# Patient Record
Sex: Female | Born: 1985 | Race: Black or African American | Hispanic: No | Marital: Married | Smoking: Never smoker
Health system: Southern US, Community
[De-identification: ages and names within clinical notes are randomized; demographics above are authoritative.]

## PROBLEM LIST (undated history)

## (undated) DIAGNOSIS — Z5900 Homelessness unspecified: Secondary | ICD-10-CM

## (undated) DIAGNOSIS — J45909 Unspecified asthma, uncomplicated: Secondary | ICD-10-CM

---

## 2016-11-03 ENCOUNTER — Encounter (HOSPITAL_COMMUNITY): Payer: Self-pay | Admitting: *Deleted

## 2016-11-03 ENCOUNTER — Emergency Department (HOSPITAL_COMMUNITY): Payer: Self-pay

## 2016-11-03 DIAGNOSIS — J40 Bronchitis, not specified as acute or chronic: Secondary | ICD-10-CM | POA: Insufficient documentation

## 2016-11-03 NOTE — ED Triage Notes (Signed)
Pt reports "asthma flare up" about an hour ago.  Pt's husband is also being seen.  She reports productive cough with "clear" sputum.. Denies any fever.  Pt is A&Ox 4.  Completes a sentence without pausing.

## 2016-11-04 ENCOUNTER — Emergency Department (HOSPITAL_COMMUNITY)
Admission: EM | Admit: 2016-11-04 | Discharge: 2016-11-04 | Disposition: A | Discharge status: Home / Self Care | Payer: Self-pay | Attending: Emergency Medicine | Admitting: Emergency Medicine

## 2016-11-04 DIAGNOSIS — J40 Bronchitis, not specified as acute or chronic: Secondary | ICD-10-CM

## 2016-11-04 HISTORY — DX: Unspecified asthma, uncomplicated: J45.909

## 2016-11-04 MED ORDER — IPRATROPIUM-ALBUTEROL 0.5-2.5 (3) MG/3ML IN SOLN
3.0000 mL | Freq: Once | RESPIRATORY_TRACT | Status: AC
  Administered 2016-11-04: 3 mL via RESPIRATORY_TRACT

## 2016-11-04 MED ORDER — PREDNISONE 10 MG PO TABS: 40.0000 mg | ORAL_TABLET | Freq: Every day | ORAL | 0 refills | Status: DC

## 2016-11-04 MED ORDER — ALBUTEROL SULFATE (2.5 MG/3ML) 0.083% IN NEBU
2.5000 mg | INHALATION_SOLUTION | Freq: Once | RESPIRATORY_TRACT | Status: AC
  Administered 2016-11-04: 2.5 mg via RESPIRATORY_TRACT
  Filled 2016-11-04: qty 3

## 2016-11-04 MED ORDER — PREDNISONE 20 MG PO TABS
ORAL_TABLET | ORAL | Status: AC
  Filled 2016-11-04: qty 3

## 2016-11-04 MED ORDER — IPRATROPIUM-ALBUTEROL 0.5-2.5 (3) MG/3ML IN SOLN
RESPIRATORY_TRACT | Status: AC
  Filled 2016-11-04: qty 3

## 2016-11-04 MED ORDER — CEPHALEXIN 500 MG PO CAPS: 500.0000 mg | ORAL_CAPSULE | Freq: Two times a day (BID) | ORAL | 0 refills | Status: DC

## 2016-11-04 MED ORDER — ALBUTEROL SULFATE HFA 108 (90 BASE) MCG/ACT IN AERS
2.0000 | INHALATION_SPRAY | Freq: Once | RESPIRATORY_TRACT | Status: AC
  Administered 2016-11-04: 2 via RESPIRATORY_TRACT
  Filled 2016-11-04: qty 6.7

## 2016-11-04 MED ORDER — PREDNISONE 20 MG PO TABS
60.0000 mg | ORAL_TABLET | Freq: Once | ORAL | Status: AC
  Administered 2016-11-04: 60 mg via ORAL

## 2016-11-04 NOTE — ED Notes (Signed)
RT paged for 3rd neb tx

## 2016-11-04 NOTE — Discharge Instructions (Signed)
Take prednisone as prescribed until all gone. Take Keflex as prescribed until all gone. Use inhaler 2 puffs every 4 hours. Please follow-up with family doctor.

## 2016-11-04 NOTE — ED Provider Notes (Signed)
[facility]-EMERGENCY DEPT Provider Note   CSN: [id number] Arrival date & time: 11/03/16  1957     History   Chief Complaint Chief Complaint  Patient presents with  . Shortness of Breath    HPI Lindsay Hatfield is a 31 y.o. female.  HPI Lindsay Hatfield is a 31 y.o. female with history of asthma, presents to emergency department complaining cough and shortness of breath. Patient states her symptoms began approximately a week ago. She reports cough with productive thick mucus. She reports that today she has started feeling shortness of breath and wheezing. She did not try any medications prior to coming in. Denies any fever. She reports nasal congestion, mucus, sore throat. Denies any extremity swelling. No difficulty speaking. No other complaints.  Past Medical History:  Diagnosis Date  . Asthma     There are no active problems to display for this patient.   History reviewed. No pertinent surgical history.  OB History    No data available       Home Medications    Prior to Admission medications   Not on File    Family History No family history on file.  Social History Social History  Substance Use Topics  . Smoking status: Never Smoker  . Smokeless tobacco: Never Used  . Alcohol use No     Allergies   Patient has no known allergies.   Review of Systems Review of Systems  Constitutional: Negative for chills and fever.  HENT: Positive for congestion, sinus pain and sore throat.   Respiratory: Positive for cough, shortness of breath and wheezing. Negative for chest tightness.   Cardiovascular: Negative for chest pain, palpitations and leg swelling.  Gastrointestinal: Negative for abdominal pain, diarrhea, nausea and vomiting.  Genitourinary: Negative for dysuria, flank pain and pelvic pain.  Musculoskeletal: Negative for arthralgias, myalgias, neck pain and neck stiffness.  Skin: Negative for rash.  Neurological: Negative for dizziness, weakness and  headaches.  All other systems reviewed and are negative.    Physical Exam Updated Vital Signs BP (!) 147/97 (BP Location: Left Arm)   Pulse (!) 105   Temp 98.2 F (36.8 C) (Oral)   Resp 20   Ht 5\' 6"  (1.676 m)   Wt (!) 173.7 kg (383 lb)   LMP 10/20/2016   SpO2 99%   BMI 61.82 kg/m   Physical Exam  Constitutional: She appears well-developed and well-nourished. No distress.  HENT:  Head: Normocephalic.  Eyes: Conjunctivae are normal.  Neck: Neck supple.  Cardiovascular: Normal rate, regular rhythm and normal heart sounds.   Pulmonary/Chest: Effort normal. No respiratory distress. She has wheezes. She has no rales.  Inspiratory and expiratory wheezes bilaterally, coughing  Abdominal: Soft. Bowel sounds are normal. She exhibits no distension. There is no tenderness. There is no rebound.  Musculoskeletal: She exhibits no edema.  Neurological: She is alert.  Skin: Skin is warm and dry.  Psychiatric: She has a normal mood and affect. Her behavior is normal.  Nursing note and vitals reviewed.    ED Treatments / Results  Labs (all labs ordered are listed, but only abnormal results are displayed) Labs Reviewed - No data to display  EKG  EKG Interpretation None       Radiology Dg Chest 2 View  Result Date: 11/03/2016 CLINICAL DATA:  Dyspnea and asthma EXAM: CHEST  2 VIEW COMPARISON:  None. FINDINGS: The heart size and mediastinal contours are within normal limits. Both lungs are clear. The visualized skeletal structures are unremarkable. IMPRESSION:  No active cardiopulmonary disease. Electronically Signed   By: Tollie Eth M.D.   On: 11/03/2016 20:37    Procedures Procedures (including critical care time)  Medications Ordered in ED Medications  ipratropium-albuterol (DUONEB) 0.5-2.5 (3) MG/3ML nebulizer solution 3 mL (not administered)  predniSONE (DELTASONE) tablet 60 mg (not administered)     Initial Impression / Assessment and Plan / ED Course  I have  reviewed the triage vital signs and the nursing notes.  Pertinent labs & imaging results that were available during my care of the patient were reviewed by me and considered in my medical decision making (see chart for details).     Patient in emergency department with wheezing, cough, URI. Patient does have inspiratory neck surgery wheezes bilaterally on exam. Chest x-ray was obtained at triage is negative. Patient has normal oxygen saturation. Will try DuoNeb, prednisone. Will reassess.  4:09 AM Patient received 3 breathing treatments. She is feeling slightly better, continues to cough up mucus. Stable for discharge home at this time. Will start on Keflex, she states she is unable to afford any other antibiotic unless it is on the $4 list, but in setting of productive sputum, cough for over a week, asthma, I think patient may benefit from antibiotic treatment. Will also prescribe a burst of prednisone. Inhaler provided to take home. Return precautions discussed. Advised to follow-up with the family doctor.  Vitals:   11/04/16 0215 11/04/16 0245 11/04/16 0300 11/04/16 0315  BP:      Pulse: 97 100 (!) 103 (!) 103  Resp:      Temp:      TempSrc:      SpO2: 100% 97% 98% 97%  Weight:      Height:         Final Clinical Impressions(s) / ED Diagnoses   Final diagnoses:  Bronchitis    New Prescriptions New Prescriptions   CEPHALEXIN (KEFLEX) 500 MG CAPSULE    Take 1 capsule (500 mg total) by mouth 2 (two) times daily.   PREDNISONE (DELTASONE) 10 MG TABLET    Take 4 tablets (40 mg total) by mouth daily.     Jaynie Crumble, PA-C 11/04/16 0411    Devoria Albe, MD 11/04/16 [phone]

## 2017-05-27 ENCOUNTER — Emergency Department (HOSPITAL_COMMUNITY)
Admission: EM | Admit: 2017-05-27 | Discharge: 2017-05-28 | Disposition: A | Discharge status: Home / Self Care | Payer: Self-pay | Attending: Emergency Medicine | Admitting: Emergency Medicine

## 2017-05-27 ENCOUNTER — Encounter (HOSPITAL_COMMUNITY): Payer: Self-pay | Admitting: Emergency Medicine

## 2017-05-27 ENCOUNTER — Other Ambulatory Visit: Payer: Self-pay

## 2017-05-27 DIAGNOSIS — J4521 Mild intermittent asthma with (acute) exacerbation: Secondary | ICD-10-CM | POA: Insufficient documentation

## 2017-05-27 DIAGNOSIS — L03011 Cellulitis of right finger: Secondary | ICD-10-CM | POA: Insufficient documentation

## 2017-05-27 MED ORDER — ALBUTEROL SULFATE HFA 108 (90 BASE) MCG/ACT IN AERS
1.0000 | INHALATION_SPRAY | Freq: Once | RESPIRATORY_TRACT | Status: AC
  Administered 2017-05-28: 2 via RESPIRATORY_TRACT
  Filled 2017-05-27: qty 6.7

## 2017-05-27 MED ORDER — ALBUTEROL SULFATE (2.5 MG/3ML) 0.083% IN NEBU
5.0000 mg | INHALATION_SOLUTION | Freq: Once | RESPIRATORY_TRACT | Status: AC
  Administered 2017-05-27: 5 mg via RESPIRATORY_TRACT
  Filled 2017-05-27: qty 6

## 2017-05-27 MED ORDER — PREDNISONE 20 MG PO TABS
60.0000 mg | ORAL_TABLET | Freq: Once | ORAL | Status: AC
  Administered 2017-05-27: 60 mg via ORAL
  Filled 2017-05-27: qty 3

## 2017-05-27 MED ORDER — PREDNISONE 10 MG PO TABS: 20.0000 mg | ORAL_TABLET | Freq: Every day | ORAL | 0 refills | Status: AC

## 2017-05-27 NOTE — ED Provider Notes (Signed)
[facility]-EMERGENCY DEPT Provider Note   CSN: [id number] Arrival date & time: 05/27/17  1912     History   Chief Complaint Chief Complaint  Patient presents with  . Hand Pain  . Otalgia  . Wheezing    HPI Lindsay Hatfield is a 32 y.o. female with past medical history of asthma, presenting to the ED with multiple complaints.  Patient complaining of acute onset of pain to her right third finger surrounding the nail.  She states she noticed it today, that it was swollen and painful.  She states she stuck a pin in it and drained purulent fluid.  She states the pain is significantly improved since that time.  Patient second complaint is that she has been having increased wheezing associated with her asthma with the weather change.  She states she used her albuterol inhaler once today at 11 AM, however has not used it since.  She denies difficulty breathing, upper respiratory symptoms, or other complaints.  Not seen her PCP in over a year regarding her asthma management.  Requesting a new inhaler today as hers is nearly out. Patient's there is complaint is itching/tingling sensation in her right ear.  Does endorse seasonal allergies, however has not taken any medications for allergies.  Denies ear pain, decreased hearing, or drainage.  The history is provided by the patient.    Past Medical History:  Diagnosis Date  . Asthma     There are no active problems to display for this patient.   History reviewed. No pertinent surgical history.  OB History    No data available       Home Medications    Prior to Admission medications   Medication Sig Start Date End Date Taking? Authorizing Provider  albuterol (PROVENTIL HFA;VENTOLIN HFA) 108 (90 Base) MCG/ACT inhaler Inhale 2 puffs into the lungs every 6 (six) hours as needed for wheezing or shortness of breath.   Yes [provider]  cephALEXin (KEFLEX) 500 MG capsule Take 1 capsule (500 mg total) by  mouth 2 (two) times daily. Patient not taking: Reported on 05/27/2017 11/04/16   Jaynie CrumbleKirichenko, Tatyana, PA-C  predniSONE (DELTASONE) 10 MG tablet Take 2 tablets (20 mg total) by mouth daily for 5 days. 05/27/17 06/01/17  Tocara Mennen, [place] N, PA-C    Family History History reviewed. No pertinent family history.  Social History Social History   Tobacco Use  . Smoking status: Never Smoker  . Smokeless tobacco: Never Used  Substance Use Topics  . Alcohol use: No  . Drug use: No     Allergies   Patient has no known allergies.   Review of Systems Review of Systems  Constitutional: Negative for fever.  HENT: Negative for congestion, ear discharge and ear pain.        Right ear itching  Respiratory: Positive for wheezing. Negative for cough and shortness of breath.   Musculoskeletal:       Right third finger pain and swelling  All other systems reviewed and are negative.    Physical Exam Updated Vital Signs BP (!) 149/96 (BP Location: Left Wrist)   Pulse 90   Temp 98.4 F (36.9 C) (Oral)   Resp 18   Ht 5\' 6"  (1.676 m)   Wt (!) 173.9 kg (383 lb 4.8 oz)   LMP 05/15/2017   SpO2 97%   BMI 61.87 kg/m   Physical Exam  Constitutional: She appears well-developed and well-nourished.  Morbidly obese.  Well-appearing, not distressed.  HENT:  Head: Normocephalic and atraumatic.  Right Ear: Tympanic membrane, external ear and ear canal normal.  Left Ear: Tympanic membrane, external ear and ear canal normal.  Mouth/Throat: Oropharynx is clear and moist.  Eyes: Conjunctivae are normal.  Pulmonary/Chest: Effort normal. No stridor. No respiratory distress. She has wheezes. She has no rales.  No increased work of breathing.  Very mild expiratory wheezes bilaterally.  Musculoskeletal:  Medial aspect of right distal third digit with erythema and mild swelling surrounding medial border of fingernail.  Area is not fluctuant, with scant purulent drainage noted.  Digit with normal range of  motion.  Psychiatric: She has a normal mood and affect. Her behavior is normal.  Nursing note and vitals reviewed.    ED Treatments / Results  Labs (all labs ordered are listed, but only abnormal results are displayed) Labs Reviewed - No data to display  EKG  EKG Interpretation None       Radiology No results found.  Procedures Procedures (including critical care time)  Medications Ordered in ED Medications  albuterol (PROVENTIL HFA;VENTOLIN HFA) 108 (90 Base) MCG/ACT inhaler 1-2 puff (not administered)  albuterol (PROVENTIL) (2.5 MG/3ML) 0.083% nebulizer solution 5 mg (5 mg Nebulization Given 05/27/17 2322)  predniSONE (DELTASONE) tablet 60 mg (60 mg Oral Given 05/27/17 2259)     Initial Impression / Assessment and Plan / ED Course  I have reviewed the triage vital signs and the nursing notes.  Pertinent labs & imaging results that were available during my care of the patient were reviewed by me and considered in my medical decision making (see chart for details).  Clinical Course as of May 28 2351  Thu May 27, 2017  2341 Patient reevaluated, with clear lung sounds and improvement in respiratory symptoms.  Will discharge with albuterol inhaler.  [JR]    Clinical Course User Index [JR] Christyana Corwin, [place] N, PA-C    Patient presenting to the ED with multiple complaints, including wheezing secondary to her asthma, right third digit pain which appears to be paronychia, and right ear itching.  On exam, patient without increased work of breathing, very mild expiratory wheezes bilaterally.  O2 saturation 96% on room air.  Right third digit with what appears to be a paronychia that patient drained earlier today; no fluctuance amenable to drainage in the ED.  Ear exam is unremarkable. Albuterol neb and dose of prednisone given in the ED, with improvement in symptoms and lung sounds.  Will send with new albuterol inhaler and recommendation that she follow-up with her PCP.  Also  encouraged warm water soaks/warm compresses for paronychia.  Lastly, recommend patient take allergy medications for the itching in her ears, as exam is unremarkable without signs of infection.  Patient is well-appearing, safe for discharge at this time.  Discussed results, findings, treatment and follow up. Patient advised of return precautions. Patient verbalized understanding and agreed with plan.  Final Clinical Impressions(s) / ED Diagnoses   Final diagnoses:  Exacerbation of intermittent asthma, unspecified asthma severity  Paronychia of right middle finger    ED Discharge Orders        Ordered    predniSONE (DELTASONE) 10 MG tablet  Daily     05/27/17 2346       Maysin Carstens, [place] N, [place] 05/27/17 2353    Maia Plan, MD 05/28/17 1004

## 2017-05-27 NOTE — ED Triage Notes (Signed)
Pt states she has been wheezing a lot due to her asthma and the weather change  Pt also states her middle finger on her right hand is swollen and painful around the nail bed and has been oozing pus  Pt is also c/o her right ear has been tingling and itching

## 2017-05-27 NOTE — Discharge Instructions (Signed)
Please read instructions below.  Soak/flush your wound with warm water, multiple times per day. You can take Advil/ibuprofen every 6 hours as needed for pain. Use your inhaler every 4-6 hours as needed for shortness of breath. Follow up with your primary care or urgent care for wound recheck in 2 days; as well as for management of your asthma. Return to the ER for fever, shortness of breath not improved with your inhaler, or new or concerning symptoms.

## 2017-07-12 ENCOUNTER — Encounter (HOSPITAL_BASED_OUTPATIENT_CLINIC_OR_DEPARTMENT_OTHER): Payer: Self-pay

## 2017-07-12 ENCOUNTER — Other Ambulatory Visit: Payer: Self-pay

## 2017-07-12 ENCOUNTER — Emergency Department (HOSPITAL_BASED_OUTPATIENT_CLINIC_OR_DEPARTMENT_OTHER)
Admission: EM | Admit: 2017-07-12 | Discharge: 2017-07-13 | Disposition: A | Discharge status: Home / Self Care | Payer: Self-pay | Attending: Emergency Medicine | Admitting: Emergency Medicine

## 2017-07-12 DIAGNOSIS — J45909 Unspecified asthma, uncomplicated: Secondary | ICD-10-CM | POA: Insufficient documentation

## 2017-07-12 DIAGNOSIS — L03011 Cellulitis of right finger: Secondary | ICD-10-CM | POA: Insufficient documentation

## 2017-07-12 MED ORDER — DOXYCYCLINE HYCLATE 100 MG PO TABS
100.0000 mg | ORAL_TABLET | Freq: Once | ORAL | Status: AC
  Administered 2017-07-13: 100 mg via ORAL
  Filled 2017-07-12: qty 1

## 2017-07-12 MED ORDER — CEPHALEXIN 250 MG PO CAPS
500.0000 mg | ORAL_CAPSULE | Freq: Once | ORAL | Status: AC
  Administered 2017-07-13: 500 mg via ORAL
  Filled 2017-07-12: qty 2

## 2017-07-12 NOTE — ED Triage Notes (Signed)
C/o pain/swellling to right middle finger x 1 month-NAD-steady gait

## 2017-07-13 ENCOUNTER — Encounter (HOSPITAL_BASED_OUTPATIENT_CLINIC_OR_DEPARTMENT_OTHER): Payer: Self-pay | Admitting: Emergency Medicine

## 2017-07-13 MED ORDER — CEPHALEXIN 500 MG PO CAPS: 500.0000 mg | ORAL_CAPSULE | Freq: Four times a day (QID) | ORAL | 0 refills | Status: DC

## 2017-07-13 MED ORDER — DOXYCYCLINE HYCLATE 100 MG PO CAPS: 100.0000 mg | ORAL_CAPSULE | Freq: Two times a day (BID) | ORAL | 0 refills | Status: DC

## 2017-07-13 NOTE — ED Notes (Signed)
Pt verbalizes understanding of d/c instructions and denies any further needs at this time. 

## 2017-07-13 NOTE — ED Provider Notes (Signed)
[facility] EMERGENCY DEPARTMENT Provider Note   CSN: [id number] Arrival date & time: 07/12/17  2113     History   Chief Complaint Chief Complaint  Patient presents with  . Hand Pain    HPI Lindsay Hatfield is a 32 y.o. female.  The history is provided by the patient.  Hand Pain  This is a new problem. The current episode started more than 1 week ago (beginning of March). The problem occurs constantly. The problem has not changed since onset.Pertinent negatives include no chest pain, no abdominal pain and no shortness of breath. Nothing aggravates the symptoms. Nothing relieves the symptoms. She has tried nothing for the symptoms. The treatment provided no relief.  Has swelling about the cuticle of the right middle finger.  Has been poking it with a needle without improvement.  No streaking.    Past Medical History:  Diagnosis Date  . Asthma     There are no active problems to display for this patient.   History reviewed. No pertinent surgical history.   OB History   None      Home Medications    Prior to Admission medications   Medication Sig Start Date End Date Taking? Authorizing Provider  albuterol (PROVENTIL HFA;VENTOLIN HFA) 108 (90 Base) MCG/ACT inhaler Inhale 2 puffs into the lungs every 6 (six) hours as needed for wheezing or shortness of breath.    [provider]  cephALEXin (KEFLEX) 500 MG capsule Take 1 capsule (500 mg total) by mouth 2 (two) times daily. Patient not taking: Reported on 05/27/2017 11/04/16   Jaynie Crumble, PA-C  cephALEXin (KEFLEX) 500 MG capsule Take 1 capsule (500 mg total) by mouth 4 (four) times daily. 07/13/17   Jose Alleyne, MD  doxycycline (VIBRAMYCIN) 100 MG capsule Take 1 capsule (100 mg total) by mouth 2 (two) times daily. One po bid x 7 days 07/13/17   Bonnetta Allbee, MD    Family History No family history on file.  Social History Social History   Tobacco Use  . Smoking status: Never Smoker  .  Smokeless tobacco: Never Used  Substance Use Topics  . Alcohol use: No  . Drug use: No     Allergies   Patient has no known allergies.   Review of Systems Review of Systems  Constitutional: Negative for fever.  Respiratory: Negative for shortness of breath.   Cardiovascular: Negative for chest pain.  Gastrointestinal: Negative for abdominal pain.  Musculoskeletal: Negative for back pain.  Skin: Negative for color change.  All other systems reviewed and are negative.    Physical Exam Updated Vital Signs BP (!) 152/105 (BP Location: Left Arm)   Pulse 80   Temp 98.3 F (36.8 C) (Oral)   Resp 20   Ht  (1.676 m)   Wt (!) 179.4 kg (395 lb 8.1 oz)   LMP 07/06/2017   SpO2 98%   BMI 63.84 kg/m   Physical Exam  Constitutional: She is oriented to person, place, and time. She appears well-developed and well-nourished. No distress.  HENT:  Head: Normocephalic and atraumatic.  Mouth/Throat: No oropharyngeal exudate.  Eyes: Pupils are equal, round, and reactive to light. Conjunctivae are normal.  Neck: Normal range of motion. Neck supple.  Cardiovascular: Normal rate, regular rhythm, normal heart sounds and intact distal pulses.  Pulmonary/Chest: Effort normal and breath sounds normal. No stridor. She has no wheezes. She has no rales.  Abdominal: Soft. Bowel sounds are normal. She exhibits no mass. There is no  tenderness. There is no guarding.  Musculoskeletal: Normal range of motion.       Right hand: She exhibits normal capillary refill. Normal sensation noted. Normal strength noted.       Hands: Neurological: She is alert and oriented to person, place, and time.  Skin: Skin is warm and dry. Capillary refill takes less than 2 seconds.  Psychiatric: She has a normal mood and affect.     ED Treatments / Results   Procedures .Marland KitchenIncision and Drainage Date/Time: 07/13/2017 12:07 AM Performed by: Cy Blamer, MD Authorized by: Cy Blamer, MD   Consent:     Consent obtained:  Verbal   Consent given by:  Patient   Risks discussed:  Incomplete drainage and infection   Alternatives discussed:  No treatment Location:    Indications for incision and drainage: paronychia.   Location:  Upper extremity   Upper extremity location:  Finger   Finger location:  R long finger Pre-procedure details:    Skin preparation:  Chloraprep Anesthesia (see MAR for exact dosages):    Anesthesia method:  None Procedure type:    Complexity:  Simple Procedure details:    Incision types:  Single straight   Incision and drainage depth: under cuticle.   Scalpel blade:  11   Wound management:  Irrigated with saline and probed and deloculated   Drainage:  Purulent   Drainage amount:  Moderate   Wound treatment:  Wound left open   Packing materials:  None Post-procedure details:    Patient tolerance of procedure:  Tolerated well, no immediate complications   (including critical care time)  Medications Ordered in ED Medications  doxycycline (VIBRA-TABS) tablet 100 mg (has no administration in time range)  cephALEXin (KEFLEX) capsule 500 mg (has no administration in time range)      Final Clinical Impressions(s) / ED Diagnoses   Final diagnoses:  Paronychia of right middle finger   Follow up with hand surgery if symptoms persist  Patient verbalizes understanding and agrees to follow up.    Return for weakness, numbness, changes in vision or speech, fevers >100.4 unrelieved by medication, shortness of breath, intractable vomiting, or diarrhea, abdominal pain, Inability to tolerate liquids or food, cough, altered mental status or any concerns. No signs of systemic illness or infection. The patient is nontoxic-appearing on exam and vital signs are within normal limits.   I have reviewed the triage vital signs and the nursing notes. Pertinent labs &imaging results that were available during my care of the patient were reviewed by me and considered in my  medical decision making (see chart for details).  After history, exam, and medical workup I feel the patient has been appropriately medically screened and is safe for discharge home. Pertinent diagnoses were discussed with the patient. Patient was given return precautions.  ED Discharge Orders        Ordered    doxycycline (VIBRAMYCIN) 100 MG capsule  2 times daily     07/13/17 0006    cephALEXin (KEFLEX) 500 MG capsule  4 times daily     07/13/17 0006       Vibha Ferdig, MD 07/13/17 [phone]

## 2017-12-31 ENCOUNTER — Other Ambulatory Visit: Payer: Self-pay

## 2017-12-31 ENCOUNTER — Emergency Department (HOSPITAL_BASED_OUTPATIENT_CLINIC_OR_DEPARTMENT_OTHER)
Admission: EM | Admit: 2017-12-31 | Discharge: 2017-12-31 | Disposition: A | Discharge status: Home / Self Care | Payer: Self-pay | Attending: Emergency Medicine | Admitting: Emergency Medicine

## 2017-12-31 ENCOUNTER — Encounter (HOSPITAL_BASED_OUTPATIENT_CLINIC_OR_DEPARTMENT_OTHER): Payer: Self-pay

## 2017-12-31 DIAGNOSIS — J9801 Acute bronchospasm: Secondary | ICD-10-CM | POA: Insufficient documentation

## 2017-12-31 DIAGNOSIS — J45909 Unspecified asthma, uncomplicated: Secondary | ICD-10-CM | POA: Insufficient documentation

## 2017-12-31 DIAGNOSIS — Z79899 Other long term (current) drug therapy: Secondary | ICD-10-CM | POA: Insufficient documentation

## 2017-12-31 HISTORY — DX: Morbid (severe) obesity due to excess calories: E66.01

## 2017-12-31 MED ORDER — ALBUTEROL SULFATE (2.5 MG/3ML) 0.083% IN NEBU
2.5000 mg | INHALATION_SOLUTION | Freq: Once | RESPIRATORY_TRACT | Status: AC
  Administered 2017-12-31: 2.5 mg via RESPIRATORY_TRACT
  Filled 2017-12-31: qty 3

## 2017-12-31 MED ORDER — ALBUTEROL SULFATE HFA 108 (90 BASE) MCG/ACT IN AERS
2.0000 | INHALATION_SPRAY | RESPIRATORY_TRACT | Status: DC | PRN
  Administered 2017-12-31: 2 via RESPIRATORY_TRACT
  Filled 2017-12-31: qty 6.7

## 2017-12-31 MED ORDER — IPRATROPIUM-ALBUTEROL 0.5-2.5 (3) MG/3ML IN SOLN
3.0000 mL | Freq: Once | RESPIRATORY_TRACT | Status: AC
  Administered 2017-12-31: 3 mL via RESPIRATORY_TRACT
  Filled 2017-12-31: qty 3

## 2017-12-31 MED ORDER — DEXAMETHASONE SODIUM PHOSPHATE 10 MG/ML IJ SOLN
10.0000 mg | Freq: Once | INTRAMUSCULAR | Status: AC
  Administered 2017-12-31: 10 mg via INTRAMUSCULAR
  Filled 2017-12-31: qty 1

## 2017-12-31 NOTE — ED Triage Notes (Signed)
Pt arrives with c/o SOB. Hx of asthma. Started at [employer]. Used inhaler @00 :15 x 2 times. No relief.  Upon arrival, pt with labored breathing, audible wheezing. 100% on RA.

## 2017-12-31 NOTE — ED Provider Notes (Signed)
[facility] Provider Note: Lowella Dell, MD, FACEP  CSN: [id number] MRN: [id number] ARRIVAL: 12/31/17 at 0027 ROOM: MH05/[facility]05   CHIEF COMPLAINT  Shortness of Breath   HISTORY OF PRESENT ILLNESS  12/31/17 2:10 AM Lindsay Hatfield is a 32 y.o. female with a history of morbid obesity and asthma.  She is here with acute shortness of breath that began at midnight.  It was associated with severe chest tightness but no frank pain.  She used her inhaler twice without relief.  On arrival she had labored breathing with inspiratory and expiratory wheezes.  Her oxygen saturation was noted to be normal.  She was given 5 mg of nebulized albuterol with 0.5 mg of nebulized ipratropium with significant improvement.  It did cause her to have significant tremor which has subsequently resolved.     Past Medical History:  Diagnosis Date  . Asthma   . Morbid obesity (HCC)     History reviewed. No pertinent surgical history.  History reviewed. No pertinent family history.  Social History   Tobacco Use  . Smoking status: Never Smoker  . Smokeless tobacco: Never Used  Substance Use Topics  . Alcohol use: No  . Drug use: No    Prior to Admission medications   Medication Sig Start Date End Date Taking? Authorizing Provider  albuterol (PROVENTIL HFA;VENTOLIN HFA) 108 (90 Base) MCG/ACT inhaler Inhale 2 puffs into the lungs every 6 (six) hours as needed for wheezing or shortness of breath.    [provider]  cephALEXin (KEFLEX) 500 MG capsule Take 1 capsule (500 mg total) by mouth 2 (two) times daily. Patient not taking: Reported on 05/27/2017 11/04/16   Jaynie Crumble, PA-C  cephALEXin (KEFLEX) 500 MG capsule Take 1 capsule (500 mg total) by mouth 4 (four) times daily. 07/13/17   Palumbo, April, MD  doxycycline (VIBRAMYCIN) 100 MG capsule Take 1 capsule (100 mg total) by mouth 2 (two) times daily. One po bid x 7 days 07/13/17   Nicanor Alcon, April, MD    Allergies Patient has  no known allergies.   REVIEW OF SYSTEMS  Negative except as noted here or in the History of Present Illness.   PHYSICAL EXAMINATION  Initial Vital Signs Blood pressure 131/75, pulse 62, temperature 97.6 F (36.4 C), temperature source Oral, resp. rate 17, height 5\' 6"  (1.676 m), weight (!) 179.4 kg, last menstrual period 12/30/2017, SpO2 100 %.  Examination General: Well-developed, obese female in no acute distress; appearance consistent with age of record HENT: normocephalic; atraumatic Eyes: pupils equal, round and reactive to light; extraocular muscles intact Neck: supple Heart: regular rate and rhythm Lungs: clear to auscultation bilaterally Abdomen: soft; nontender; bowel sounds present Extremities: No deformity; full range of motion; pulses normal Neurologic: Awake, alert and oriented; motor function intact in all extremities and symmetric; no facial droop Skin: Warm and dry Psychiatric: Normal mood and affect   RESULTS  Summary of this visit's results, reviewed by myself:   EKG Interpretation  Date/Time:    Ventricular Rate:    PR Interval:    QRS Duration:   QT Interval:    QTC Calculation:   R Axis:     Text Interpretation:        Laboratory Studies: No results found for this or any previous visit (from the past 24 hour(s)). Imaging Studies: No results found.  ED COURSE and MDM  Nursing notes and initial vitals signs, including pulse oximetry, reviewed.  Vitals:   12/31/17 0041 12/31/17 0042 12/31/17  0046 12/31/17 0052  BP:    131/75  Pulse:    62  Resp:    17  Temp:      TempSrc:      SpO2:   100% 100%  Weight:  (!) 179.4 kg    Height: 5\' 6"  (1.676 m)      Patient got significant relief with a nebulizer treatment here but also developed tremor as a result.  Lack of efficacy with her home inhaler, which lacks an AeroChamber, as well as lack of tremor at home suggests that she is either not using her inhaler properly or it is empty.  We will  provide her a new inhaler and AeroChamber and instruct her in its use.  PROCEDURES    ED DIAGNOSES     ICD-10-CM   1. Acute bronchospasm J98.01        Sandon Yoho, Jonny Ruiz, MD 12/31/17 [phone]

## 2018-03-18 ENCOUNTER — Encounter (HOSPITAL_BASED_OUTPATIENT_CLINIC_OR_DEPARTMENT_OTHER): Payer: Self-pay | Admitting: Emergency Medicine

## 2018-03-18 ENCOUNTER — Emergency Department (HOSPITAL_BASED_OUTPATIENT_CLINIC_OR_DEPARTMENT_OTHER)
Admission: EM | Admit: 2018-03-18 | Discharge: 2018-03-18 | Disposition: A | Discharge status: Home / Self Care | Payer: Self-pay | Attending: Emergency Medicine | Admitting: Emergency Medicine

## 2018-03-18 ENCOUNTER — Other Ambulatory Visit: Payer: Self-pay

## 2018-03-18 DIAGNOSIS — R062 Wheezing: Secondary | ICD-10-CM | POA: Insufficient documentation

## 2018-03-18 HISTORY — DX: Homelessness: Z59.0

## 2018-03-18 HISTORY — DX: Homelessness unspecified: Z59.00

## 2018-03-18 MED ORDER — ALBUTEROL SULFATE HFA 108 (90 BASE) MCG/ACT IN AERS
2.0000 | INHALATION_SPRAY | Freq: Once | RESPIRATORY_TRACT | Status: AC
  Administered 2018-03-18: 2 via RESPIRATORY_TRACT
  Filled 2018-03-18: qty 6.7

## 2018-03-18 MED ORDER — IPRATROPIUM-ALBUTEROL 0.5-2.5 (3) MG/3ML IN SOLN
3.0000 mL | Freq: Once | RESPIRATORY_TRACT | Status: AC
  Administered 2018-03-18: 3 mL via RESPIRATORY_TRACT
  Filled 2018-03-18: qty 3

## 2018-03-18 MED ORDER — PREDNISONE 50 MG PO TABS
60.0000 mg | ORAL_TABLET | Freq: Once | ORAL | Status: AC
  Administered 2018-03-18: 60 mg via ORAL
  Filled 2018-03-18: qty 1

## 2018-03-18 MED ORDER — PREDNISONE 20 MG PO TABS: 40.0000 mg | ORAL_TABLET | Freq: Every day | ORAL | 0 refills | Status: AC

## 2018-03-18 NOTE — ED Triage Notes (Signed)
Pt and significant other were picked up on side of road by EMS. Pt states they are currently homeless.

## 2018-03-18 NOTE — Discharge Instructions (Addendum)
You were evaluated in the Emergency Department and after careful evaluation, we did not find any emergent condition requiring admission or further testing in the hospital.  Your symptoms today seem to be due to a mild flare of your asthma.  Please use the inhaler every 4-6 hours as needed for wheezing.  Please take the prednisone prescription as directed.  Please return to the Emergency Department if you experience any worsening of your condition.  We encourage you to follow up with a primary care provider.  Thank you for allowing [place] to be a part of your care.

## 2018-03-18 NOTE — ED Provider Notes (Signed)
[facility] Emergency Department Provider Note MRN:  [id number]  Arrival date & time: 03/18/18     Chief Complaint   Asthma   History of Present Illness   Lindsay Hatfield is a 33 y.o. year-old female with a history of asthma presenting to the ED with chief complaint of asthma.  3 to 4 hours prior to arrival, patient was walking outside to get some fresh air and began experiencing wheezing, shortness of breath, chest tightness consistent with prior episodes of asthma exacerbation.  Symptoms were constant, moderate in severity.  Denies headache or vision change, no recent cold-like symptoms, no abdominal pain, no numbness weakness to the arms or legs.  Review of Systems  A complete 10 system review of systems was obtained and all systems are negative except as noted in the HPI and PMH.   Patient's Health History    Past Medical History:  Diagnosis Date  . Asthma   . Homelessness   . Morbid obesity (HCC)     History reviewed. No pertinent surgical history.  No family history on file.  Social History   Socioeconomic History  . Marital status: Married    Spouse name: Not on file  . Number of children: Not on file  . Years of education: Not on file  . Highest education level: Not on file  Occupational History  . Not on file  Social Needs  . Financial resource strain: Not on file  . Food insecurity:    Worry: Not on file    Inability: Not on file  . Transportation needs:    Medical: Not on file    Non-medical: Not on file  Tobacco Use  . Smoking status: Never Smoker  . Smokeless tobacco: Never Used  Substance and Sexual Activity  . Alcohol use: No  . Drug use: No  . Sexual activity: Not on file  Lifestyle  . Physical activity:    Days per week: Not on file    Minutes per session: Not on file  . Stress: Not on file  Relationships  . Social connections:    Talks on phone: Not on file    Gets together: Not on file    Attends religious  service: Not on file    Active member of club or organization: Not on file    Attends meetings of clubs or organizations: Not on file    Relationship status: Not on file  . Intimate partner violence:    Fear of current or ex partner: Not on file    Emotionally abused: Not on file    Physically abused: Not on file    Forced sexual activity: Not on file  Other Topics Concern  . Not on file  Social History Narrative  . Not on file     Physical Exam  Vital Signs and Nursing Notes reviewed Vitals:   03/18/18 2237 03/18/18 2314  BP:    Pulse: 87 88  Resp:    Temp:    SpO2: 100% 98%    CONSTITUTIONAL: Well-appearing, NAD NEURO:  Alert and oriented x 3, no focal deficits EYES:  eyes equal and reactive ENT/NECK:  no LAD, no JVD CARDIO: Regular rate, well-perfused, normal S1 and S2 PULM: Scattered wheezing GI/GU:  normal bowel sounds, non-distended, non-tender MSK/SPINE:  No gross deformities, no edema SKIN:  no rash, atraumatic PSYCH:  Appropriate speech and behavior  Diagnostic and Interventional Summary    Labs Reviewed - No data to display  No  orders to display    Medications  albuterol (PROVENTIL HFA;VENTOLIN HFA) 108 (90 Base) MCG/ACT inhaler 2 puff (has no administration in time range)  ipratropium-albuterol (DUONEB) 0.5-2.5 (3) MG/3ML nebulizer solution 3 mL (3 mLs Nebulization Given 03/18/18 2207)  predniSONE (DELTASONE) tablet 60 mg (60 mg Oral Given 03/18/18 2207)     Procedures Critical Care  ED Course and Medical Decision Making  I have reviewed the triage vital signs and the nursing notes.  Pertinent labs & imaging results that were available during my care of the patient were reviewed by me and considered in my medical decision making (see below for details).  Consistent with mild asthma exacerbation or acute bronchoconstriction, wheezing much improved after DuoNeb here in the ED.  Will provide with albuterol inhaler, prescription for prednisone burst.  PERC  negative.  After the discussed management above, the patient was determined to be safe for discharge.  The patient was in agreement with this plan and all questions regarding their care were answered.  ED return precautions were discussed and the patient will return to the ED with any significant worsening of condition.  Elmer SowMichael M. Pilar PlateBero, MD [facility] [email or web address]  Final Clinical Impressions(s) / ED Diagnoses     ICD-10-CM   1. Wheezing R06.2     ED Discharge Orders         Ordered    predniSONE (DELTASONE) 20 MG tablet  Daily     03/18/18 2323             Sabas SousBero, Navdeep Halt M, MD 03/18/18 2324

## 2018-03-18 NOTE — ED Notes (Signed)
Warm blankets given per request

## 2018-03-18 NOTE — ED Triage Notes (Signed)
Pt with asthma exacerbation tonight. Pt received Dueoneb by EMS and 125mg  of solumedrol IV.

## 2019-03-20 ENCOUNTER — Emergency Department (HOSPITAL_COMMUNITY)
Admission: EM | Admit: 2019-03-20 | Discharge: 2019-03-20 | Disposition: A | Discharge status: Home / Self Care | Payer: Self-pay | Attending: Emergency Medicine | Admitting: Emergency Medicine

## 2019-03-20 ENCOUNTER — Emergency Department (HOSPITAL_COMMUNITY): Payer: Self-pay

## 2019-03-20 ENCOUNTER — Other Ambulatory Visit: Payer: Self-pay

## 2019-03-20 ENCOUNTER — Encounter (HOSPITAL_COMMUNITY): Payer: Self-pay

## 2019-03-20 DIAGNOSIS — Z20822 Contact with and (suspected) exposure to covid-19: Secondary | ICD-10-CM | POA: Insufficient documentation

## 2019-03-20 DIAGNOSIS — Z79899 Other long term (current) drug therapy: Secondary | ICD-10-CM | POA: Insufficient documentation

## 2019-03-20 DIAGNOSIS — J45909 Unspecified asthma, uncomplicated: Secondary | ICD-10-CM | POA: Insufficient documentation

## 2019-03-20 DIAGNOSIS — R059 Cough, unspecified: Secondary | ICD-10-CM

## 2019-03-20 DIAGNOSIS — R05 Cough: Secondary | ICD-10-CM | POA: Insufficient documentation

## 2019-03-20 MED ORDER — BENZONATATE 100 MG PO CAPS: 100.0000 mg | ORAL_CAPSULE | Freq: Three times a day (TID) | ORAL | 0 refills | Status: AC

## 2019-03-20 MED ORDER — ONDANSETRON 4 MG PO TBDP: 4.0000 mg | ORAL_TABLET | Freq: Three times a day (TID) | ORAL | 0 refills | Status: AC | PRN

## 2019-03-20 MED ORDER — ALBUTEROL SULFATE HFA 108 (90 BASE) MCG/ACT IN AERS
2.0000 | INHALATION_SPRAY | Freq: Once | RESPIRATORY_TRACT | Status: AC
  Administered 2019-03-20: 2 via RESPIRATORY_TRACT
  Filled 2019-03-20: qty 6.7

## 2019-03-20 MED ORDER — PREDNISONE 10 MG (21) PO TBPK: ORAL_TABLET | ORAL | 0 refills | Status: DC

## 2019-03-20 MED ORDER — PREDNISONE 20 MG PO TABS
60.0000 mg | ORAL_TABLET | Freq: Once | ORAL | Status: AC
  Administered 2019-03-20: 60 mg via ORAL
  Filled 2019-03-20: qty 3

## 2019-03-20 MED ORDER — ONDANSETRON 4 MG PO TBDP: 4.0000 mg | ORAL_TABLET | Freq: Three times a day (TID) | ORAL | 0 refills | Status: DC | PRN

## 2019-03-20 MED ORDER — ALBUTEROL SULFATE HFA 108 (90 BASE) MCG/ACT IN AERS: 2.0000 | INHALATION_SPRAY | Freq: Four times a day (QID) | RESPIRATORY_TRACT | 1 refills | Status: AC | PRN

## 2019-03-20 MED ORDER — BENZONATATE 100 MG PO CAPS
200.0000 mg | ORAL_CAPSULE | Freq: Once | ORAL | Status: AC
  Administered 2019-03-20: 200 mg via ORAL
  Filled 2019-03-20: qty 2

## 2019-03-20 MED ORDER — BENZONATATE 100 MG PO CAPS: 100.0000 mg | ORAL_CAPSULE | Freq: Three times a day (TID) | ORAL | 0 refills | Status: DC

## 2019-03-20 MED ORDER — ALBUTEROL SULFATE HFA 108 (90 BASE) MCG/ACT IN AERS: 2.0000 | INHALATION_SPRAY | Freq: Four times a day (QID) | RESPIRATORY_TRACT | 1 refills | Status: DC | PRN

## 2019-03-20 MED ORDER — PREDNISONE 10 MG (21) PO TBPK: ORAL_TABLET | ORAL | 0 refills | Status: AC

## 2019-03-20 NOTE — Discharge Instructions (Addendum)
Test Results for COVID-19 pending  You have a test pending for COVID-19.  Results typically return within about 48 hours.  Be sure to check MyChart for updated results.  We recommend isolating yourself until results are received.  Patients who have symptoms consistent with COVID-19 should self isolated for: At least 3 days (72 hours) have passed since recovery, defined as resolution of fever without the use of fever reducing medications and improvement in respiratory symptoms (e.g., cough, shortness of breath), and At least 7 days have passed since symptoms first appeared.  If you have no symptoms, but your test returns positive, recommend isolating for at least 10 days.   Your symptoms are likely consistent with a viral illness. Viruses do not require or respond to antibiotics. Treatment is symptomatic care and it is important to note that these symptoms may last for 7-14 days.   Hand washing: Wash your hands throughout the day, but especially before and after touching the face, using the restroom, sneezing, coughing, or touching surfaces that have been coughed or sneezed upon. Hydration: Symptoms of most illnesses will be intensified and complicated by dehydration. Dehydration can also extend the duration of symptoms. Drink plenty of fluids and get plenty of rest. You should be drinking at least half a liter of water an hour to stay hydrated. Electrolyte drinks (ex. Gatorade, Powerade, Pedialyte) are also encouraged. You should be drinking enough fluids to make your urine light yellow, almost clear. If this is not the case, you are not drinking enough water. Please note that some of the treatments indicated below will not be effective if you are not adequately hydrated. Diet: Please concentrate on hydration, however, you may introduce food slowly.  Start with a clear liquid diet, progressed to a full liquid diet, and then bland solids as you are able. Pain or fever: Ibuprofen, Naproxen, or  acetaminophen (generic for Tylenol) for pain or fever.  Antiinflammatory medications: Take 600 mg of ibuprofen every 6 hours or 440 mg (over the counter dose) to 500 mg (prescription dose) of naproxen every 12 hours for the next 3 days. After this time, these medications may be used as needed for pain. Take these medications with food to avoid upset stomach. Choose only one of these medications, do not take them together. Acetaminophen (generic for Tylenol): Should you continue to have additional pain while taking the ibuprofen or naproxen, you may add in acetaminophen as needed. Your daily total maximum amount of acetaminophen from all sources should be limited to 4000mg /day for persons without liver problems, or 2000mg /day for those with liver problems. Nausea/vomiting: Use the ondansetron (generic for Zofran) for nausea or vomiting.  This medication may not prevent all vomiting or nausea, but can help facilitate better hydration. Things that can help with nausea/vomiting also include peppermint/menthol candies, vitamin B12, and ginger. Cough: Use the benzonatate (generic for Tessalon) for cough.  Teas, warm liquids, broths, and honey can also help with cough. Albuterol: May use the albuterol as needed for instances of shortness of breath. Prednisone: Take the prednisone, as directed, in its entirety. Zyrtec or Claritin: May add these medication daily to control underlying symptoms of congestion, sneezing, and other signs of allergies.  These medications are available over-the-counter. Generics: Cetirizine (generic for Zyrtec) and loratadine (generic for Claritin). Fluticasone: Use fluticasone (generic for Flonase), as directed, for nasal and sinus congestion.  This medication is available over-the-counter. Congestion: Plain guaifenesin (generic for plain Mucinex) may help relieve congestion. Saline sinus rinses and saline nasal  sprays may also help relieve congestion.  Sore throat: Warm liquids or  Chloraseptic spray may help soothe a sore throat. Gargle twice a day with a salt water solution made from a half teaspoon of salt in a cup of warm water.  Follow up: Follow up with a primary care provider within the next two weeks should symptoms fail to resolve. Return: Return to the ED for significantly worsening symptoms, shortness of breath, persistent vomiting, large amounts of blood in stool, or any other major concerns.  For prescription assistance, may try using prescription discount sites or apps, such as goodrx.com

## 2019-03-20 NOTE — ED Provider Notes (Signed)
Lindsay Hatfield Provider Note   CSN: [id number] Arrival date & time: 03/20/19  1714     History Chief Complaint  Patient presents with  . Cough    Lindsay Hatfield is a 34 y.o. female.  HPI      Lindsay Hatfield is a 34 y.o. female, with a history of asthma and morbid obesity, presenting to the ED with nonproductive cough for the past 2-3 days. Occasional wheezing and shortness of breath that she states feels like exacerbation of her asthma. She had one episode of nausea and vomiting 2 days ago, but none since. [place] last [place]. LMP December 12. Denies fever/chills, diarrhea, abdominal pain, chest pain, syncope, or any other complaints.   Past Medical History:  Diagnosis Date  . Asthma   . Homelessness   . Morbid obesity (HCC)     There are no problems to display for this patient.   History reviewed. No pertinent surgical history.   OB History   No obstetric history on file.     Family History  Problem Relation Age of Onset  . Cancer Mother     Social History   Tobacco Use  . Smoking status: Never Smoker  . Smokeless tobacco: Never Used  Substance Use Topics  . Alcohol use: No  . Drug use: No    Home Medications Prior to Admission medications   Medication Sig Start Date End Date Taking? Authorizing Provider  albuterol (VENTOLIN HFA) 108 (90 Base) MCG/ACT inhaler Inhale 2 puffs into the lungs every 6 (six) hours as needed for wheezing or shortness of breath. 03/20/19   Tallula Grindle C, PA-C  benzonatate (TESSALON) 100 MG capsule Take 1 capsule (100 mg total) by mouth every 8 (eight) hours. 03/20/19   Karysa Heft C, PA-C  ondansetron (ZOFRAN ODT) 4 MG disintegrating tablet Take 1 tablet (4 mg total) by mouth every 8 (eight) hours as needed for nausea or vomiting. 03/20/19   Aubryn Spinola C, PA-C  predniSONE (STERAPRED UNI-PAK 21 TAB) 10 MG (21) TBPK tablet Take 6 tabs (60mg ) day 1, 5 tabs (50mg ) day 2, 4  tabs (40mg ) day 3, 3 tabs (30mg ) day 4, 2 tabs (20mg ) day 5, and 1 tab (10mg ) day 6. 03/20/19   Nohea Kras C, PA-C    Allergies    Patient has no known allergies.  Review of Systems   Review of Systems  Constitutional: Negative for chills, diaphoresis and fever.  Respiratory: Positive for cough and shortness of breath.   Cardiovascular: Negative for chest pain and leg swelling.  Gastrointestinal: Positive for nausea (resolved) and vomiting (resolved). Negative for abdominal pain.  Neurological: Negative for syncope and weakness.  All other systems reviewed and are negative.   Physical Exam Updated Vital Signs BP (!) 156/95 (BP Location: Left Arm)   Pulse 85   Temp 98.3 F (36.8 C) (Oral)   Resp 16   Ht 5\' 6"  (1.676 m)   Wt 113.4 kg   LMP 02/25/2019   SpO2 99%   BMI 40.35 kg/m   Physical Exam Vitals and nursing note reviewed.  Constitutional:      General: She is not in acute distress.    Appearance: She is well-developed. She is obese. She is not diaphoretic.  HENT:     Head: Normocephalic and atraumatic.     Mouth/Throat:     Mouth: Mucous membranes are moist.     Pharynx: Oropharynx is clear.  Eyes:  Conjunctiva/sclera: Conjunctivae normal.  Cardiovascular:     Rate and Rhythm: Normal rate and regular rhythm.     Pulses: Normal pulses.          Radial pulses are 2+ on the right side and 2+ on the left side.     Heart sounds: Normal heart sounds.  Pulmonary:     Effort: Pulmonary effort is normal. No respiratory distress.     Breath sounds: Normal breath sounds.     Comments: No increased work of breathing.  Speaks in full sentences without difficulty.  Laughing and joking around. Abdominal:     Palpations: Abdomen is soft.     Tenderness: There is no abdominal tenderness. There is no guarding.  Musculoskeletal:     Cervical back: Neck supple.     Right lower leg: No edema.     Left lower leg: No edema.  Lymphadenopathy:     Cervical: No cervical  adenopathy.  Skin:    General: Skin is warm and dry.  Neurological:     Mental Status: She is alert.  Psychiatric:        Mood and Affect: Mood and affect normal.        Speech: Speech normal.        Behavior: Behavior normal.     ED Results / Procedures / Treatments   Labs (all labs ordered are listed, but only abnormal results are displayed) Labs Reviewed  NOVEL CORONAVIRUS, NAA (HOSP ORDER, SEND-OUT TO REF LAB; TAT 18-24 HRS)    EKG None  Radiology DG Chest Portable 1 View  Result Date: 03/20/2019 CLINICAL DATA:  Cough and vomiting. EXAM: PORTABLE CHEST 1 VIEW COMPARISON:  July 04, 2016 FINDINGS: There is no evidence of acute infiltrate, pleural effusion or pneumothorax. The cardiac silhouette is moderately enlarged. This is increased in size when compared to the prior exam. The visualized skeletal structures are unremarkable. IMPRESSION: 1. No acute cardiopulmonary findings. 2. Increasing size of the cardiac silhouette since the prior study, dated November 03, 2016. Interval development of a pericardial effusion cannot be excluded. Electronically Signed   By: Aram Candela M.D.   On: 03/20/2019 18:43    Procedures Procedures (including critical care time)  Medications Ordered in ED Medications  albuterol (VENTOLIN HFA) 108 (90 Base) MCG/ACT inhaler 2 puff (has no administration in time range)    ED Course  I have reviewed the triage vital signs and the nursing notes.  Pertinent labs & imaging results that were available during my care of the patient were reviewed by me and considered in my medical decision making (see chart for details).  Clinical Course as of Mar 20 2011  Mon Mar 20, 2019  4468 34 year old female here with cough and shortness of breath.  She satting well and nontoxic appearing.  Chest x-ray had read that she had some cardiomegaly that change so I did a bedside ultrasound.  It was tough to get any good images on her but I do not see any obvious  pericardial effusion.   [MB]    Clinical Course User Index [MB] Lindsay Files, MD   MDM Rules/Calculators/A&P                      Patient presents with cough and intermittent shortness of breath. Patient is nontoxic appearing, afebrile, not tachycardic, not tachypneic, not hypotensive, maintains SPO2 on room air, and is in no apparent distress.  Question of pericardial effusion on chest x-ray.  My suspicion for this is low.  Bedside ultrasound performed with Dr. Melina Copa.  No pericardial effusion appreciated. The patient was given instructions for home care as well as return precautions. Patient voices understanding of these instructions, accepts the plan, and is comfortable with discharge.  Final Clinical Impression(s) / ED Diagnoses Final diagnoses:  Cough    Rx / DC Orders ED Discharge Orders         Ordered    albuterol (VENTOLIN HFA) 108 (90 Base) MCG/ACT inhaler  Every 6 hours PRN     03/20/19 1750    predniSONE (STERAPRED UNI-PAK 21 TAB) 10 MG (21) TBPK tablet     03/20/19 1750    benzonatate (TESSALON) 100 MG capsule  Every 8 hours     03/20/19 1750    ondansetron (ZOFRAN ODT) 4 MG disintegrating tablet  Every 8 hours PRN     03/20/19 1750           Lorayne Bender, PA-C 03/20/19 2017    Hayden Rasmussen, MD 03/21/19 [phone]

## 2019-03-20 NOTE — ED Triage Notes (Signed)
Patient c/o cough and emesis 2 days ago. Patient also c/o wheezing and using her Albuterol inhaler with a spacer.

## 2019-03-20 NOTE — ED Notes (Signed)
ED Provider at bedside. 

## 2019-03-21 LAB — NOVEL CORONAVIRUS, NAA (HOSP ORDER, SEND-OUT TO REF LAB; TAT 18-24 HRS): SARS-CoV-2, NAA: NOT DETECTED

## 2019-03-21 NOTE — ED Provider Notes (Signed)
Ultrasound ED Echo  Date/Time: 03/21/2019 12:38 AM Performed by: Terrilee Files, MD Authorized by: Terrilee Files, MD   Procedure details:    Indications: dyspnea     Views: parasternal short axis view     Images: archived     Limitations:  Body habitus Findings:    Pericardium: no pericardial effusion   Impression:    Impression: normal     Clinical Course as of Mar 20 36  Mon Mar 20, 2019  5731 34 year old female here with cough and shortness of breath.  She satting well and nontoxic appearing.  Chest x-ray had read that she had some cardiomegaly that change so I did a bedside ultrasound.  It was tough to get any good images on her but I do not see any obvious pericardial effusion.   [MB]    Clinical Course User Index [MB] Terrilee Files, MD      Terrilee Files, MD 03/21/19 [phone]

## 2019-08-15 DEATH — deceased

## 2021-08-04 IMAGING — DX DG CHEST 1V PORT
1 series · 1 of 1 positions shown · non-contrast
Comparison: July 04, 2016

CLINICAL DATA: Cough and vomiting.

EXAM:
PORTABLE CHEST 1 VIEW

[chest ap]
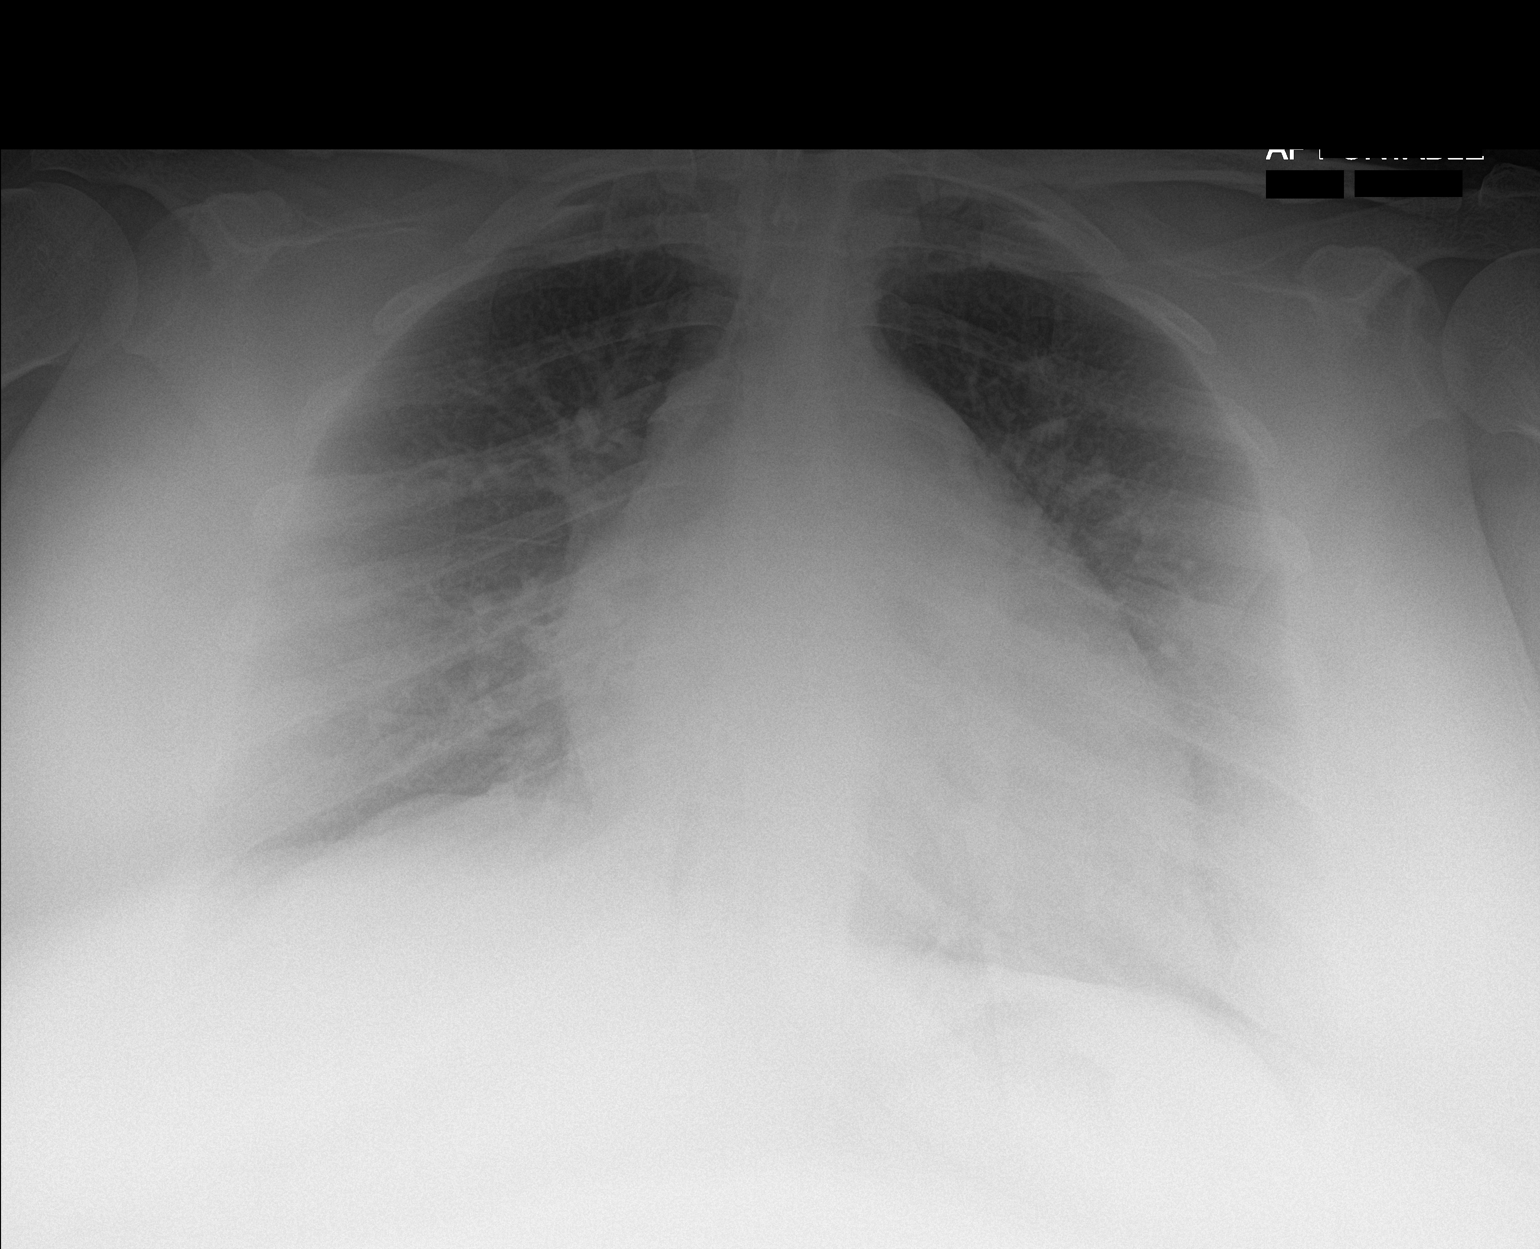

[1 of 1 positions shown; findings below may reference images not displayed]

FINDINGS: There is no evidence of acute infiltrate, pleural effusion or
pneumothorax. The cardiac silhouette is moderately enlarged. This is
increased in size when compared to the prior exam. The visualized
skeletal structures are unremarkable.
IMPRESSION: 1. No acute cardiopulmonary findings.
2. Increasing size of the cardiac silhouette since the prior study,
dated November 03, 2016. Interval development of a pericardial
effusion cannot be excluded.
